# Patient Record
Sex: Male | Born: 1985
Health system: Southern US, Community
[De-identification: ages and names within clinical notes are randomized; demographics above are authoritative.]

## PROBLEM LIST (undated history)

## (undated) DIAGNOSIS — G43019 Migraine without aura, intractable, without status migrainosus: Secondary | ICD-10-CM

## (undated) HISTORY — PX: OTHER SURGICAL HISTORY: SHX169

## (undated) HISTORY — DX: Migraine without aura, intractable, without status migrainosus: G43.019

---

## 2014-01-20 ENCOUNTER — Encounter (HOSPITAL_COMMUNITY): Payer: Self-pay | Admitting: Emergency Medicine

## 2014-01-20 ENCOUNTER — Emergency Department (HOSPITAL_COMMUNITY)
Admission: EM | Admit: 2014-01-20 | Discharge: 2014-01-20 | Disposition: A | Discharge status: Home / Self Care | Payer: 59 | Attending: Emergency Medicine | Admitting: Emergency Medicine

## 2014-01-20 DIAGNOSIS — Y9389 Activity, other specified: Secondary | ICD-10-CM | POA: Insufficient documentation

## 2014-01-20 DIAGNOSIS — Z23 Encounter for immunization: Secondary | ICD-10-CM | POA: Insufficient documentation

## 2014-01-20 DIAGNOSIS — S0501XA Injury of conjunctiva and corneal abrasion without foreign body, right eye, initial encounter: Secondary | ICD-10-CM

## 2014-01-20 DIAGNOSIS — IMO0002 Reserved for concepts with insufficient information to code with codable children: Secondary | ICD-10-CM | POA: Insufficient documentation

## 2014-01-20 DIAGNOSIS — Y929 Unspecified place or not applicable: Secondary | ICD-10-CM | POA: Insufficient documentation

## 2014-01-20 DIAGNOSIS — S058X9A Other injuries of unspecified eye and orbit, initial encounter: Secondary | ICD-10-CM | POA: Insufficient documentation

## 2014-01-20 MED ORDER — IBUPROFEN 400 MG PO TABS
800.0000 mg | ORAL_TABLET | Freq: Once | ORAL | Status: AC
  Administered 2014-01-20: 800 mg via ORAL
  Filled 2014-01-20: qty 2

## 2014-01-20 MED ORDER — POLYMYXIN B-TRIMETHOPRIM 10000-0.1 UNIT/ML-% OP SOLN: 1.0000 [drp] | OPHTHALMIC | Status: AC

## 2014-01-20 MED ORDER — TETANUS-DIPHTH-ACELL PERTUSSIS 5-2.5-18.5 LF-MCG/0.5 IM SUSP
0.5000 mL | Freq: Once | INTRAMUSCULAR | Status: AC
  Administered 2014-01-20: 0.5 mL via INTRAMUSCULAR
  Filled 2014-01-20: qty 0.5

## 2014-01-20 MED ORDER — TETRACAINE HCL 0.5 % OP SOLN
1.0000 [drp] | Freq: Once | OPHTHALMIC | Status: AC
  Administered 2014-01-20: 1 [drp] via OPHTHALMIC
  Filled 2014-01-20: qty 2

## 2014-01-20 MED ORDER — FLUORESCEIN SODIUM 1 MG OP STRP
1.0000 | ORAL_STRIP | Freq: Once | OPHTHALMIC | Status: AC
  Administered 2014-01-20: 1 via OPHTHALMIC
  Filled 2014-01-20: qty 1

## 2014-01-20 NOTE — ED Notes (Signed)
Pt sts daughter scratched right eye with finger nail today; pt noted to have tearing and redness to right eye; pt denies vision change

## 2014-01-20 NOTE — Discharge Instructions (Signed)
Take Ibuprofen or Tylenol for pain  Use antibiotic drops and follow-up with the eye doctor next week  Return to the emergency department if you develop any changing/worsening condition, fever, eye swelling/redness, drainage of pus, or any other concerns (please read additional information regarding your condition below)  Corneal Abrasion The cornea is the clear covering at the front and center of the eye. When looking at the colored portion of the eye (iris), you are looking through the cornea. This very thin tissue is made up of many layers. The surface layer is a single layer of cells (corneal epithelium) and is one of the most sensitive tissues in the body. If a scratch or injury causes the corneal epithelium to come off, it is called a corneal abrasion. If the injury extends to the tissues below the epithelium, the condition is called a corneal ulcer. CAUSES   Scratches.  Trauma.  Foreign body in the eye. Some people have recurrences of abrasions in the area of the original injury even after it has healed (recurrent erosion syndrome). Recurrent erosion syndrome generally improves and goes away with time. SYMPTOMS   Eye pain.  Difficulty or inability to keep the injured eye open.  The eye becomes very sensitive to light.  Recurrent erosions tend to happen suddenly, first thing in the morning, usually after waking up and opening the eye. DIAGNOSIS  Your health care provider can diagnose a corneal abrasion during an eye exam. Dye is usually placed in the eye using a drop or a small paper strip moistened by your tears. When the eye is examined with a special light, the abrasion shows up clearly because of the dye. TREATMENT   Small abrasions may be treated with antibiotic drops or ointment alone.  Usually a pressure patch is specially applied. Pressure patches prevent the eye from blinking, allowing the corneal epithelium to heal. A pressure patch also reduces the amount of pain present  in the eye during healing. Most corneal abrasions heal within 2 3 days with no effect on vision. If the abrasion becomes infected and spreads to the deeper tissues of the cornea, a corneal ulcer can result. This is serious because it can cause corneal scarring. Corneal scars interfere with light passing through the cornea and cause a loss of vision in the involved eye. HOME CARE INSTRUCTIONS  Use medicine or ointment as directed. Only take over-the-counter or prescription medicines for pain, discomfort, or fever as directed by your health care provider.  Do not drive or operate machinery while your eye is patched. Your ability to judge distances is impaired.  If your health care provider has given you a follow-up appointment, it is very important to keep that appointment. Not keeping the appointment could result in a severe eye infection or permanent loss of vision. If there is any problem keeping the appointment, let your health care provider know. SEEK MEDICAL CARE IF:   You have pain, light sensitivity, and a scratchy feeling in one eye or both eyes.  Your pressure patch keeps loosening up, and you can blink your eye under the patch after treatment.  Any kind of discharge develops from the eye after treatment or if the lids stick together in the morning.  You have the same symptoms in the morning as you did with the original abrasion days, weeks, or months after the abrasion healed. MAKE SURE YOU:   Understand these instructions.  Will watch your condition.  Will get help right away if you are not  doing well or get worse. Document Released: 10/02/2000 Document Revised: 07/26/2013 Document Reviewed: 06/12/2013 [facility] Patient Information 2014 [employer].   Emergency Department Resource Guide 1) Find a Doctor and Pay Out of Pocket Although you won't have to find out who is covered by your insurance plan, it is a good idea to ask around and get recommendations. You will then need  to call the office and see if the doctor you have chosen will accept you as a new patient and what types of options they offer for patients who are self-pay. Some doctors offer discounts or will set up payment plans for their patients who do not have insurance, but you will need to ask so you aren't surprised when you get to your appointment.  2) Contact Your Local Health Department Not all health departments have doctors that can see patients for sick visits, but many do, so it is worth a call to see if yours does. If you don't know where your local health department is, you can check in your phone book. The CDC also has a tool to help you locate your state's health department, and many state websites also have listings of all of their local health departments.  3) Find a [facility] If your illness is not likely to be very severe or complicated, you may want to try a walk in clinic. These are popping up all over the country in pharmacies, drugstores, and shopping centers. They're usually staffed by nurse practitioners or physician assistants that have been trained to treat common illnesses and complaints. They're usually fairly quick and inexpensive. However, if you have serious medical issues or chronic medical problems, these are probably not your best option.  No Primary Care Doctor: - Call Health Connect at  [phone] - they can help you locate a primary care doctor that  accepts your insurance, provides certain services, etc. - Physician Referral Service- [phone]  Chronic Pain Problems: Organization         Address  Phone   Notes  [facility]  [phone] Patients need to be referred by their primary care doctor.   Medication Assistance: Organization         Address  Phone   Notes  [facility] Medication [facility] [address], [place] [address] [phone] --Must be a resident of [facility] -- Must have NO insurance  coverage whatsoever (no Medicaid/ Medicare, etc.) -- The pt. MUST have a primary care doctor that directs their care regularly and follows them in the community   MedAssist  [phone]   [employer]  [phone]    Agencies that provide inexpensive medical care: Organization         Address  Phone   Notes  [place]  [phone]   [facility]    [phone]   [facility] [place], [place] [address] [phone]   Bernie [address] [place] [phone]   Planned Parenthood    [phone]   [facility]    [phone]   [place] and [place] [address], [place] Phone:  [phone], Fax:  [phone] Hours of Operation:  9 am - 6 pm, M-F.  Also accepts Medicaid/Medicare and self-pay.  [facility] for [address], [place] Phone: [phone], Fax: [phone]. Hours of Operation:  8:30 am - 5:30 pm, M-F.  Also accepts Medicaid and self-pay.  [facility] [place] [address] [place] Phone: [phone]   [place], [place], [place] [phone], Ext. [phone] Mondays & Thursdays: 7-9 AM.  First 15 patients are seen on a first come, first serve basis.    [place] Providers:  Organization         Address  Phone   Notes  [facility] [address], [place] [phone] Also accepts self-pay patients.  [facility] [address], [place]  [phone]   [place], [address], [place] [phone]   [facility] [address], [place] [phone]   Lucianne Lei [address], [place]   [phone] Only accepts [place] Access [place] patients after they have their  name applied to their card.   Self-Pay (no insurance) in [facility]:  Organization         Address  Phone   Notes  Sickle Cell Patients, [facility] Internal [facility] [place] [phone]   [facility] [place] [phone]   [facility] [place]  [place], [place], [place] [phone]   [facility]/Dr. Osei-Bonsu  [address] [place] or [address], [place] [phone] Phone number for both [place] and [place] locations is the same.  Urgent Medical and [facility] [address], [place] [phone]   [facility] [address], [place] or [address] [phone]   [facility] [address], [place] [phone], phone; [phone], fax Sees patients 1st and 3rd Saturday of every month.  Must not qualify for public or private insurance (i.e. Medicaid, Medicare, [place] Health Choice, Veterans' Benefits)  Household income should be no more than 200% of the poverty level The clinic cannot treat you if you are pregnant or think you are pregnant  Sexually transmitted diseases are not treated at the clinic.    Dental Care: Organization         Address  Phone  Notes  [facility] [place] [phone] Accepts children up to age 109 who are enrolled in [place] or [place]; pregnant women with a Medicaid card; and children who have applied for Medicaid or [place] Health Choice, but were declined, whose parents can pay a reduced fee at time of service.  [facility] Department of [facility]  [address], [place] [phone] Accepts children up to age 33 who are enrolled in [place] or [place]; pregnant women with a Medicaid card; and children who have applied for  Medicaid or [place] Health Choice, but were declined, whose parents can pay a reduced fee at time of service.  [facility] Access PRO[facility]  [place] [phone] Patients are seen by appointment only. Walk-ins are not accepted. [place] will see patients 81 years of age and older. Monday - Tuesday (8am-5pm) Most Wednesdays (8:30-5pm) $30 per visit, cash only  [place] Adult [employer] PROGRAM  [address], [employer] [phone])  [phone] Patients are seen by appointment only. Walk-ins are not accepted. [place] will see patients 55 years of age and older. One Wednesday Evening (Monthly: Volunteer Based).  $30 per visit, cash only  [place]  [phone] for adults; Children under age 83, call Graduate Pediatric Dentistry at [phone]. Children aged 71-14, please call [phone] to request a pediatric application.  Dental services are provided in all areas of dental care including fillings, crowns and bridges, complete and partial dentures, implants, gum treatment, root canals, and extractions. Preventive care is also provided. Treatment is provided to both adults and children. Patients are selected via a lottery and there is often a waiting list.   [facility] [address] [place]  [phone] [email or web address]   [facility] [address] [place], [place] [phone], Ext. [phone] Second and Fourth Thursday of each month, opens at 6:30 AM; Clinic ends at 9 AM.  Patients are seen on a first-come first-served basis, and a limited number are seen during each clinic.   [facility]  [address] [place] D[place], [place] [phone]   Eligibility Requirements You must have lived in [place], [place], or [place] counties for at least the last three months.   You cannot be eligible for state or federal sponsored [employer], including [employer], [place],  or [employer].   You generally cannot be eligible for healthcare insurance through your employer.    How to apply: Eligibility screenings are held every Tuesday and Wednesday afternoon from 1:00 pm until 4:00 pm. You do not need an appointment for the interview!  [facility] [address], [place], [place]   [place]  Novelty Department  [place]  [phone]    Behavioral Health Resources in the Community: Intensive Outpatient Programs Organization         Address  Phone  Notes  [place]. [address], [place], [place] [phone]   [facility] Outpatient [address], [place], [place]   ADS: Alcohol & Drug Svcs [address] [place], [place]   [place] [address],  [place], [place] or [phone]   Substance Abuse Resources Organization         Address  Phone  Notes  Alcohol and Drug Services  [phone]   [place]  [phone]   [place]   Chinita Pester  [phone]   Residential & Outpatient Substance Abuse Program  [phone]   Psychological Services Organization         Address  Phone  Notes  [facility] [place]  [place]  [phone]   [place] [address], [place] or [phone]    Mobile Crisis Teams Organization         Address  Phone  Notes  [facility], Mobile Crisis Care Unit  [phone]   [facility]ive Psychotherapeutic Services  [address] [place], [place]   [place] Levels [address] [place] [phone]    Self-Help/Support Groups Organization         Address  Phone             Notes  [place]. of [place] - variety of support groups   [phone]-  [phone] Call for more information  Narcotics Anonymous (NA), [facility]aring [facility] [address], [place]  2 meetings at this location   Residential Sports administratorTreatment Programs Organization         Address  Phone  Notes  [facility] [address],    [place]  [phone]   [facility]  [address], [place] [phone], [place], [place] [phone]   [facility] [address], [place] [phone] Admissions: 8am-3pm M-F  [facility] [address]    [place], [place] [phone]   [facility] [address], [place], [place] [phone]   [facility] [address]  [place], [place] [phone]   [facility] - Intensive Outpatient [address], [place], [place] [phone]   [facility] ([facility] [address]  [place], [place] [phone] or [phone]   [facility]idential [facility] Services (RTS) [address] [place], [place] [phone] Accepts Medicaid  [facility] [address]  [place] [phone] Substance Abuse/Addiction Treatment   [facility] Behavioral Health Resources Organization         Address  Phone  Notes  [facility]  [phone]) [phone]   Angie FavaJulie Brannon, PhD [address], [address] [place], [place]   [phone] or [phone](336) [phone]   [facility]   [address] [place], [place] [phone]) [phone]   [facility] [address], [place], [place] [phone]) [phone] Insurance/Medicaid/sponsorship through [facility]  [facility]ith and Families [address]                                    [place], [place] [phone]) [phone] Therapy/tele-psych/case  [facility] [address]   [place], [place] [phone]    Dr. Lolly MustacheArfeen  [phone](336) [phone]   [facility]  [employer] [facility] 1) [address], [place] 2) [address], [place] 3)  [address], [place] [phone]  [phone]   [facility] Child Abuse Hotline [phone](336) [phone] or [phone]) [phone] (After Hours)

## 2014-01-20 NOTE — ED Provider Notes (Signed)
CSN: [id number]     Arrival date & time 01/20/14  1209 History  This chart was scribed for non-physician practitioner, Vernie Murders, PA-C working with Threasa Beards, MD by Frederich Balding, ED scribe. This patient was seen in room TR04C/TR04C and the patient's care was started at 1:23 PM.   Chief Complaint  Patient presents with  . Eye Injury   The history is provided by the patient. No language interpreter was used.   HPI Comments: Angel Hayes is a 28 y.o. male who presents to the Emergency Department complaining of right eye injury that occurred earlier today. Pt states his daughter scratched his eye with her fingernail today. He has sudden onset right eye pain with associated redness, tearing and photophobia. Denies fever, pus drainage, visual disturbance, visual loss, nausea, emesis. Pt does not wear contacts or glasses. He is unsure of when his last tetanus was.    History reviewed. No pertinent past medical history. History reviewed. No pertinent past surgical history. History reviewed. No pertinent family history. History  Substance Use Topics  . Smoking status: Never Smoker   . Smokeless tobacco: Not on file  . Alcohol Use: No    Review of Systems  Constitutional: Negative for fever.  Eyes: Positive for photophobia, pain and redness. Negative for discharge, itching and visual disturbance.  Gastrointestinal: Negative for nausea and vomiting.  Skin: Negative for wound.  All other systems reviewed and are negative.   Allergies  Review of patient's allergies indicates no known allergies.  Home Medications  No current outpatient prescriptions on file. BP 118/80  Pulse 77  Temp(Src) 98 F (36.7 C) (Oral)  Resp 16  SpO2 98%  Filed Vitals:   01/20/14 1219 01/20/14 1402  BP: 118/80 118/78  Pulse: 77 64  Temp: 98 F (36.7 C) 97.9 F (36.6 C)  TempSrc: Oral Oral  Resp: 16 16  SpO2: 98% 100%    Physical Exam  Nursing note and vitals reviewed. Constitutional: He  is oriented to person, place, and time. He appears well-developed and well-nourished. No distress.  HENT:  Head: Normocephalic and atraumatic.  Right Ear: External ear normal.  Left Ear: External ear normal.  Nose: Nose normal.  Mouth/Throat: Oropharynx is clear and moist.  Eyes: EOM are normal. Pupils are equal, round, and reactive to light. Left eye exhibits no discharge.    Right bulbar conjunctiva injected throughout. No masses or edema to the right eyelids. No pain with eye movement. No signs of eye trauma. Increased tearing on the right. No edema or erythema to the eight periorbit.   Neck: Normal range of motion. Neck supple. No tracheal deviation present.  No cervical lymphadenopathy. No nuchal rigidity.  Cardiovascular: Normal rate.   Pulmonary/Chest: Effort normal. No respiratory distress.  Musculoskeletal: Normal range of motion.  Neurological: He is alert and oriented to person, place, and time.  Skin: Skin is warm and dry. He is not diaphoretic.  Psychiatric: He has a normal mood and affect. His behavior is normal.    ED Course  Procedures (including critical care time)  DIAGNOSTIC STUDIES: Oxygen Saturation is 98% on RA, normal by my interpretation.    COORDINATION OF CARE: 1:28 PM-Discussed treatment plan which includes updating tetanus, antibiotic eye drops and ophthalmology follow up with pt at bedside and pt agreed to plan.   Labs Review Labs Reviewed - No data to display Imaging Review No results found.   EKG Interpretation None      MDM   Duante Trego  is a 28 y.o. male who presents to the Emergency Department complaining of right eye injury that occurred earlier today.   Rechecks  12:30 PM = Woods lamp exam revealed oblique 18mm linear abrasion to the right cornea. No foreign bodies.  2:00 PM = Bilateral Near: 20/15 ; Bilateral Distance: 20/15 ; R Near: 20/20 ; R Distance: 20/20 ; L Near: 20/20 ; L Distance: 20/20   Etiology of eye pain likely due to  corneal abrasion seen on Woods lamp exam. No foreign bodies. No orbital cellulitis. Patient afebrile and non-toxic in appearance. Vital signs stable. Antibiotic drops and ophthalmology follow-up. Return precautions, discharge instructions, and follow-up was discussed with the patient before discharge.    Discharge Medication List as of 01/20/2014  1:35 PM    START taking these medications   Details  trimethoprim-polymyxin b (POLYTRIM) ophthalmic solution Place 1 drop into the right eye every 4 (four) hours., Starting 01/20/2014, Last dose on Thu 01/25/14, Print        Final impressions: 1. Corneal abrasion, right      Denman George   I personally performed the services described in this documentation, which was scribed in my presence. The recorded information has been reviewed and is accurate.  Lucila [place], PA-C 01/21/14 1026

## 2014-01-21 NOTE — ED Provider Notes (Signed)
Medical screening examination/treatment/procedure(s) were performed by non-physician practitioner and as supervising physician I was immediately available for consultation/collaboration.   EKG Interpretation None       Threasa Beards, MD 01/21/14 [phone]

## 2017-02-12 DIAGNOSIS — E559 Vitamin D deficiency, unspecified: Secondary | ICD-10-CM | POA: Diagnosis not present

## 2017-02-12 DIAGNOSIS — R5383 Other fatigue: Secondary | ICD-10-CM | POA: Diagnosis not present

## 2017-04-16 DIAGNOSIS — R42 Dizziness and giddiness: Secondary | ICD-10-CM | POA: Diagnosis not present

## 2017-06-17 DIAGNOSIS — M25532 Pain in left wrist: Secondary | ICD-10-CM | POA: Diagnosis not present

## 2017-08-11 DIAGNOSIS — Z23 Encounter for immunization: Secondary | ICD-10-CM | POA: Diagnosis not present

## 2017-08-13 DIAGNOSIS — M25532 Pain in left wrist: Secondary | ICD-10-CM | POA: Diagnosis not present

## 2017-08-19 DIAGNOSIS — M654 Radial styloid tenosynovitis [de Quervain]: Secondary | ICD-10-CM | POA: Diagnosis not present

## 2017-08-27 DIAGNOSIS — R05 Cough: Secondary | ICD-10-CM | POA: Diagnosis not present

## 2017-09-02 DIAGNOSIS — J343 Hypertrophy of nasal turbinates: Secondary | ICD-10-CM | POA: Diagnosis not present

## 2017-09-02 DIAGNOSIS — Q385 Congenital malformations of palate, not elsewhere classified: Secondary | ICD-10-CM | POA: Diagnosis not present

## 2017-09-16 DIAGNOSIS — M654 Radial styloid tenosynovitis [de Quervain]: Secondary | ICD-10-CM | POA: Diagnosis not present

## 2017-10-07 DIAGNOSIS — Z124 Encounter for screening for malignant neoplasm of cervix: Secondary | ICD-10-CM | POA: Diagnosis not present

## 2017-10-08 DIAGNOSIS — Z13220 Encounter for screening for lipoid disorders: Secondary | ICD-10-CM | POA: Diagnosis not present

## 2017-10-08 DIAGNOSIS — Z0000 Encounter for general adult medical examination without abnormal findings: Secondary | ICD-10-CM | POA: Diagnosis not present

## 2017-10-21 DIAGNOSIS — M654 Radial styloid tenosynovitis [de Quervain]: Secondary | ICD-10-CM | POA: Diagnosis not present

## 2018-02-24 ENCOUNTER — Other Ambulatory Visit: Payer: Self-pay | Admitting: Orthopedic Surgery

## 2018-02-24 DIAGNOSIS — M654 Radial styloid tenosynovitis [de Quervain]: Secondary | ICD-10-CM

## 2018-03-18 ENCOUNTER — Ambulatory Visit
Admission: RE | Admit: 2018-03-18 | Discharge: 2018-03-18 | Disposition: A | Discharge status: Home / Self Care | Payer: 59 | Source: Ambulatory Visit | Attending: Orthopedic Surgery | Admitting: Orthopedic Surgery

## 2018-03-18 DIAGNOSIS — M654 Radial styloid tenosynovitis [de Quervain]: Secondary | ICD-10-CM

## 2018-03-18 MED ORDER — IOPAMIDOL (ISOVUE-M 200) INJECTION 41%
2.0000 mL | Freq: Once | INTRAMUSCULAR | Status: AC
  Administered 2018-03-18: 2 mL via INTRA_ARTICULAR

## 2018-03-22 DIAGNOSIS — M654 Radial styloid tenosynovitis [de Quervain]: Secondary | ICD-10-CM | POA: Diagnosis not present

## 2018-03-22 DIAGNOSIS — M25332 Other instability, left wrist: Secondary | ICD-10-CM | POA: Diagnosis not present

## 2018-03-25 DIAGNOSIS — R51 Headache: Secondary | ICD-10-CM | POA: Diagnosis not present

## 2018-03-25 DIAGNOSIS — E559 Vitamin D deficiency, unspecified: Secondary | ICD-10-CM | POA: Diagnosis not present

## 2018-03-25 DIAGNOSIS — R5383 Other fatigue: Secondary | ICD-10-CM | POA: Diagnosis not present

## 2018-03-25 DIAGNOSIS — R718 Other abnormality of red blood cells: Secondary | ICD-10-CM | POA: Diagnosis not present

## 2018-04-14 DIAGNOSIS — M654 Radial styloid tenosynovitis [de Quervain]: Secondary | ICD-10-CM | POA: Diagnosis not present

## 2018-04-14 DIAGNOSIS — M25332 Other instability, left wrist: Secondary | ICD-10-CM | POA: Diagnosis not present

## 2018-04-25 DIAGNOSIS — M6629 Spontaneous rupture of extensor tendons, multiple sites: Secondary | ICD-10-CM | POA: Diagnosis not present

## 2018-04-25 DIAGNOSIS — M66232 Spontaneous rupture of extensor tendons, left forearm: Secondary | ICD-10-CM | POA: Diagnosis not present

## 2018-04-25 DIAGNOSIS — M659 Synovitis and tenosynovitis, unspecified: Secondary | ICD-10-CM | POA: Diagnosis not present

## 2018-04-28 DIAGNOSIS — M654 Radial styloid tenosynovitis [de Quervain]: Secondary | ICD-10-CM | POA: Diagnosis not present

## 2018-04-28 DIAGNOSIS — S66912D Strain of unspecified muscle, fascia and tendon at wrist and hand level, left hand, subsequent encounter: Secondary | ICD-10-CM | POA: Diagnosis not present

## 2018-04-28 DIAGNOSIS — M25332 Other instability, left wrist: Secondary | ICD-10-CM | POA: Diagnosis not present

## 2018-05-05 DIAGNOSIS — S66912A Strain of unspecified muscle, fascia and tendon at wrist and hand level, left hand, initial encounter: Secondary | ICD-10-CM | POA: Diagnosis not present

## 2018-05-05 DIAGNOSIS — M25332 Other instability, left wrist: Secondary | ICD-10-CM | POA: Diagnosis not present

## 2018-05-05 DIAGNOSIS — S66912D Strain of unspecified muscle, fascia and tendon at wrist and hand level, left hand, subsequent encounter: Secondary | ICD-10-CM | POA: Diagnosis not present

## 2018-05-18 DIAGNOSIS — M25642 Stiffness of left hand, not elsewhere classified: Secondary | ICD-10-CM | POA: Diagnosis not present

## 2018-05-18 DIAGNOSIS — S66912D Strain of unspecified muscle, fascia and tendon at wrist and hand level, left hand, subsequent encounter: Secondary | ICD-10-CM | POA: Diagnosis not present

## 2018-05-18 DIAGNOSIS — S66912A Strain of unspecified muscle, fascia and tendon at wrist and hand level, left hand, initial encounter: Secondary | ICD-10-CM | POA: Diagnosis not present

## 2018-05-24 DIAGNOSIS — S66912D Strain of unspecified muscle, fascia and tendon at wrist and hand level, left hand, subsequent encounter: Secondary | ICD-10-CM | POA: Diagnosis not present

## 2018-05-24 DIAGNOSIS — M25642 Stiffness of left hand, not elsewhere classified: Secondary | ICD-10-CM | POA: Diagnosis not present

## 2018-05-24 DIAGNOSIS — S66912A Strain of unspecified muscle, fascia and tendon at wrist and hand level, left hand, initial encounter: Secondary | ICD-10-CM | POA: Diagnosis not present

## 2018-05-26 DIAGNOSIS — M25532 Pain in left wrist: Secondary | ICD-10-CM | POA: Diagnosis not present

## 2018-05-26 DIAGNOSIS — M25332 Other instability, left wrist: Secondary | ICD-10-CM | POA: Diagnosis not present

## 2018-05-26 DIAGNOSIS — S66912A Strain of unspecified muscle, fascia and tendon at wrist and hand level, left hand, initial encounter: Secondary | ICD-10-CM | POA: Diagnosis not present

## 2018-05-31 DIAGNOSIS — M25532 Pain in left wrist: Secondary | ICD-10-CM | POA: Diagnosis not present

## 2018-05-31 DIAGNOSIS — S66912A Strain of unspecified muscle, fascia and tendon at wrist and hand level, left hand, initial encounter: Secondary | ICD-10-CM | POA: Diagnosis not present

## 2018-05-31 DIAGNOSIS — M25642 Stiffness of left hand, not elsewhere classified: Secondary | ICD-10-CM | POA: Diagnosis not present

## 2018-06-01 DIAGNOSIS — M25532 Pain in left wrist: Secondary | ICD-10-CM | POA: Diagnosis not present

## 2018-06-01 DIAGNOSIS — M79645 Pain in left finger(s): Secondary | ICD-10-CM | POA: Diagnosis not present

## 2018-06-01 DIAGNOSIS — S66912A Strain of unspecified muscle, fascia and tendon at wrist and hand level, left hand, initial encounter: Secondary | ICD-10-CM | POA: Diagnosis not present

## 2018-06-03 DIAGNOSIS — S66912A Strain of unspecified muscle, fascia and tendon at wrist and hand level, left hand, initial encounter: Secondary | ICD-10-CM | POA: Diagnosis not present

## 2018-06-03 DIAGNOSIS — M25532 Pain in left wrist: Secondary | ICD-10-CM | POA: Diagnosis not present

## 2018-06-03 DIAGNOSIS — M25642 Stiffness of left hand, not elsewhere classified: Secondary | ICD-10-CM | POA: Diagnosis not present

## 2018-06-07 DIAGNOSIS — M25532 Pain in left wrist: Secondary | ICD-10-CM | POA: Diagnosis not present

## 2018-06-07 DIAGNOSIS — M25642 Stiffness of left hand, not elsewhere classified: Secondary | ICD-10-CM | POA: Diagnosis not present

## 2018-06-07 DIAGNOSIS — M79645 Pain in left finger(s): Secondary | ICD-10-CM | POA: Diagnosis not present

## 2018-06-13 DIAGNOSIS — M25642 Stiffness of left hand, not elsewhere classified: Secondary | ICD-10-CM | POA: Diagnosis not present

## 2018-06-13 DIAGNOSIS — M79645 Pain in left finger(s): Secondary | ICD-10-CM | POA: Diagnosis not present

## 2018-06-13 DIAGNOSIS — M25532 Pain in left wrist: Secondary | ICD-10-CM | POA: Diagnosis not present

## 2018-06-15 ENCOUNTER — Encounter: Payer: Self-pay | Admitting: Neurology

## 2018-06-15 ENCOUNTER — Ambulatory Visit (INDEPENDENT_AMBULATORY_CARE_PROVIDER_SITE_OTHER): Payer: 59 | Admitting: Neurology

## 2018-06-15 DIAGNOSIS — G43019 Migraine without aura, intractable, without status migrainosus: Secondary | ICD-10-CM | POA: Diagnosis not present

## 2018-06-15 HISTORY — DX: Migraine without aura, intractable, without status migrainosus: G43.019

## 2018-06-15 MED ORDER — RIZATRIPTAN BENZOATE 10 MG PO TABS: 10.0000 mg | ORAL_TABLET | Freq: Three times a day (TID) | ORAL | 3 refills | Status: AC | PRN

## 2018-06-15 MED ORDER — PROPRANOLOL HCL 20 MG PO TABS: 20.0000 mg | ORAL_TABLET | Freq: Two times a day (BID) | ORAL | 3 refills | Status: AC

## 2018-06-15 NOTE — Patient Instructions (Signed)
We will start propranolol daily to prevent the headaches.  Maxalt will be taken if needed for the headache.  Inderal (propranolol) is a blood pressure medication that is commonly used for migraine headaches. This is a type of beta blocker. The most common side effects include low heart rate, dizziness, fatigue, and increased depression. This medication may worsen asthma. If you believe that you are having side effects on this medication, please contact our office.

## 2018-06-15 NOTE — Progress Notes (Signed)
Reason for visit: Migraine headache  Referring physician: Dr. Beverlee Nims Sabo is a 32 y.o. male  History of present illness:  Mr. Angel Hayes is a 32 year old [place] male with a history of intractable migraine headache that has been present for about 5 years.  The patient indicates that he is averaging about 10 headache days a month.  The headaches usually start in the shoulders with a tingling sensation and then this will go up in the back of the head with onset of the headache.  The headache may be associated with photophobia and phonophobia, some nausea may be noted.  The patient may feel weak all over and occasionally he may have some vertigo.  The headaches may last 24 hours commonly, and occasionally may last several days.  The patient indicates that the headache itself is not extremely severe, he is able to go to work and function through the headache.  He usually does not take any medications, at times he has dromperidone to take which does help some.  The patient drinks 2 cups of coffee daily, no other caffeinated products.  He denies a family history of migraine.  He believes that bright lights, sleeping too much, stress and noise environments may sometimes bring on headache.  The patient denies any balance issues or difficulty controlling the bowels or the bladder.  He denies any sinus or allergy issues.  Past Medical History:  Diagnosis Date  . Common migraine with intractable migraine 06/15/2018    Past Surgical History:  Procedure Laterality Date  . Left wrist surgery     Tendinopathy    Family History  Problem Relation Age of Onset  . Diabetes Mother   . Hypertension Father   . Migraines Neg Hx     Social history:  reports that he has never smoked. He has never used smokeless tobacco. He reports that he does not drink alcohol or use drugs.  Medications:  Prior to Admission medications   Medication Sig Start Date End Date Taking? Authorizing Provider    Cholecalciferol (VITAMIN D PO) Take by mouth.   Yes [provider]  esomeprazole (NEXIUM) 20 MG packet Take 20 mg by mouth daily.   Yes [provider]  IRON PO Take by mouth.   Yes [provider]  Multiple Vitamins-Minerals (MULTIVITAMIN ADULT PO) Take by mouth.   Yes [provider]     No Known Allergies  ROS:  Out of a complete 14 system review of symptoms, the patient complains only of the following symptoms, and all other reviewed systems are negative.  Fatigue Blurred vision Increased thirst, aching muscles Headache, dizziness Not enough sleep  Blood pressure 124/78, pulse 96, height 6' (1.829 m), weight 181 lb 8 oz (82.3 kg).  Physical Exam  General: The patient is alert and cooperative at the time of the examination.  Eyes: Pupils are equal, round, and reactive to light. Discs are flat bilaterally.  Neck: The neck is supple, no carotid bruits are noted.  Respiratory: The respiratory examination is clear.  Cardiovascular: The cardiovascular examination reveals a regular rate and rhythm, no obvious murmurs or rubs are noted.  Neuromuscular: Range of movement the cervical spine is full.  No crepitus is noted in the temporomandibular joints.  Skin: Extremities are without significant edema.  Neurologic Exam  Mental status: The patient is alert and oriented x 3 at the time of the examination. The patient has apparent normal recent and remote memory, with an apparently normal  attention span and concentration ability.  Cranial nerves: Facial symmetry is present. There is good sensation of the face to pinprick and soft touch bilaterally. The strength of the facial muscles and the muscles to head turning and shoulder shrug are normal bilaterally. Speech is well enunciated, no aphasia or dysarthria is noted. Extraocular movements are full. Visual fields are full. The tongue is midline, and the patient has symmetric elevation of the soft  palate. No obvious hearing deficits are noted.  Motor: The motor testing reveals 5 over 5 strength of all 4 extremities. Good symmetric motor tone is noted throughout.  Sensory: Sensory testing is intact to pinprick, soft touch, vibration sensation, and position sense on all 4 extremities. No evidence of extinction is noted.  Coordination: Cerebellar testing reveals good finger-nose-finger and heel-to-shin bilaterally.  Gait and station: Gait is normal. Tandem gait is normal. Romberg is negative. No drift is seen.  Reflexes: Deep tendon reflexes are symmetric and normal bilaterally. Toes are downgoing bilaterally.   Assessment/Plan:  1.  Common migraine headache, intractable  The patient will be placed on propranolol 20 mg twice daily as a prophylactic medication for the headache.  Maxalt will be given to take if needed.  He will call for any dose adjustments.  He will follow-up in 3 months.  Jill Alexanders MD 06/15/2018 2:35 PM  [facility] [address] [place],  [address]  Phone [phone] Fax [phone]

## 2018-06-16 DIAGNOSIS — D1801 Hemangioma of skin and subcutaneous tissue: Secondary | ICD-10-CM | POA: Diagnosis not present

## 2018-06-16 DIAGNOSIS — L80 Vitiligo: Secondary | ICD-10-CM | POA: Diagnosis not present

## 2018-06-16 DIAGNOSIS — B081 Molluscum contagiosum: Secondary | ICD-10-CM | POA: Diagnosis not present

## 2018-06-17 DIAGNOSIS — M79645 Pain in left finger(s): Secondary | ICD-10-CM | POA: Diagnosis not present

## 2018-06-17 DIAGNOSIS — M25532 Pain in left wrist: Secondary | ICD-10-CM | POA: Diagnosis not present

## 2018-06-17 DIAGNOSIS — M25642 Stiffness of left hand, not elsewhere classified: Secondary | ICD-10-CM | POA: Diagnosis not present

## 2018-06-22 DIAGNOSIS — K219 Gastro-esophageal reflux disease without esophagitis: Secondary | ICD-10-CM | POA: Diagnosis not present

## 2018-06-23 DIAGNOSIS — M25642 Stiffness of left hand, not elsewhere classified: Secondary | ICD-10-CM | POA: Diagnosis not present

## 2018-06-23 DIAGNOSIS — M25532 Pain in left wrist: Secondary | ICD-10-CM | POA: Diagnosis not present

## 2018-06-23 DIAGNOSIS — M79645 Pain in left finger(s): Secondary | ICD-10-CM | POA: Diagnosis not present

## 2018-06-28 DIAGNOSIS — M79645 Pain in left finger(s): Secondary | ICD-10-CM | POA: Diagnosis not present

## 2018-06-28 DIAGNOSIS — M25642 Stiffness of left hand, not elsewhere classified: Secondary | ICD-10-CM | POA: Diagnosis not present

## 2018-06-28 DIAGNOSIS — M25532 Pain in left wrist: Secondary | ICD-10-CM | POA: Diagnosis not present

## 2018-07-05 DIAGNOSIS — M25532 Pain in left wrist: Secondary | ICD-10-CM | POA: Diagnosis not present

## 2018-07-05 DIAGNOSIS — M25642 Stiffness of left hand, not elsewhere classified: Secondary | ICD-10-CM | POA: Diagnosis not present

## 2018-07-05 DIAGNOSIS — M79645 Pain in left finger(s): Secondary | ICD-10-CM | POA: Diagnosis not present

## 2018-07-12 DIAGNOSIS — M79645 Pain in left finger(s): Secondary | ICD-10-CM | POA: Diagnosis not present

## 2018-07-12 DIAGNOSIS — M25532 Pain in left wrist: Secondary | ICD-10-CM | POA: Diagnosis not present

## 2018-07-12 DIAGNOSIS — M25642 Stiffness of left hand, not elsewhere classified: Secondary | ICD-10-CM | POA: Diagnosis not present

## 2018-07-14 DIAGNOSIS — L80 Vitiligo: Secondary | ICD-10-CM | POA: Diagnosis not present

## 2018-07-15 DIAGNOSIS — M25532 Pain in left wrist: Secondary | ICD-10-CM | POA: Diagnosis not present

## 2018-07-15 DIAGNOSIS — M25332 Other instability, left wrist: Secondary | ICD-10-CM | POA: Diagnosis not present

## 2018-07-15 DIAGNOSIS — S66912A Strain of unspecified muscle, fascia and tendon at wrist and hand level, left hand, initial encounter: Secondary | ICD-10-CM | POA: Diagnosis not present

## 2018-07-21 DIAGNOSIS — J069 Acute upper respiratory infection, unspecified: Secondary | ICD-10-CM | POA: Diagnosis not present

## 2018-07-22 DIAGNOSIS — S66912A Strain of unspecified muscle, fascia and tendon at wrist and hand level, left hand, initial encounter: Secondary | ICD-10-CM | POA: Diagnosis not present

## 2018-07-22 DIAGNOSIS — M79645 Pain in left finger(s): Secondary | ICD-10-CM | POA: Diagnosis not present

## 2018-07-22 DIAGNOSIS — M7912 Myalgia of auxiliary muscles, head and neck: Secondary | ICD-10-CM | POA: Diagnosis not present

## 2018-07-22 DIAGNOSIS — R51 Headache: Secondary | ICD-10-CM | POA: Diagnosis not present

## 2018-07-22 DIAGNOSIS — M25332 Other instability, left wrist: Secondary | ICD-10-CM | POA: Diagnosis not present

## 2018-07-22 DIAGNOSIS — M5412 Radiculopathy, cervical region: Secondary | ICD-10-CM | POA: Diagnosis not present

## 2018-07-27 DIAGNOSIS — M5412 Radiculopathy, cervical region: Secondary | ICD-10-CM | POA: Diagnosis not present

## 2018-07-27 DIAGNOSIS — R51 Headache: Secondary | ICD-10-CM | POA: Diagnosis not present

## 2018-07-27 DIAGNOSIS — M7912 Myalgia of auxiliary muscles, head and neck: Secondary | ICD-10-CM | POA: Diagnosis not present

## 2018-07-29 DIAGNOSIS — M5412 Radiculopathy, cervical region: Secondary | ICD-10-CM | POA: Diagnosis not present

## 2018-07-29 DIAGNOSIS — M7912 Myalgia of auxiliary muscles, head and neck: Secondary | ICD-10-CM | POA: Diagnosis not present

## 2018-07-29 DIAGNOSIS — R51 Headache: Secondary | ICD-10-CM | POA: Diagnosis not present

## 2018-08-01 DIAGNOSIS — Q385 Congenital malformations of palate, not elsewhere classified: Secondary | ICD-10-CM | POA: Diagnosis not present

## 2018-08-03 DIAGNOSIS — M7912 Myalgia of auxiliary muscles, head and neck: Secondary | ICD-10-CM | POA: Diagnosis not present

## 2018-08-03 DIAGNOSIS — R51 Headache: Secondary | ICD-10-CM | POA: Diagnosis not present

## 2018-08-03 DIAGNOSIS — M5412 Radiculopathy, cervical region: Secondary | ICD-10-CM | POA: Diagnosis not present

## 2018-08-05 DIAGNOSIS — M25332 Other instability, left wrist: Secondary | ICD-10-CM | POA: Diagnosis not present

## 2018-08-05 DIAGNOSIS — M25532 Pain in left wrist: Secondary | ICD-10-CM | POA: Diagnosis not present

## 2018-08-05 DIAGNOSIS — M79645 Pain in left finger(s): Secondary | ICD-10-CM | POA: Diagnosis not present

## 2018-08-08 ENCOUNTER — Other Ambulatory Visit: Payer: Self-pay | Admitting: Internal Medicine

## 2018-08-08 ENCOUNTER — Ambulatory Visit
Admission: RE | Admit: 2018-08-08 | Discharge: 2018-08-08 | Disposition: A | Discharge status: Home / Self Care | Payer: 59 | Source: Ambulatory Visit | Attending: Internal Medicine | Admitting: Internal Medicine

## 2018-08-08 DIAGNOSIS — R059 Cough, unspecified: Secondary | ICD-10-CM

## 2018-08-08 DIAGNOSIS — R05 Cough: Secondary | ICD-10-CM

## 2018-08-10 DIAGNOSIS — R51 Headache: Secondary | ICD-10-CM | POA: Diagnosis not present

## 2018-08-10 DIAGNOSIS — M7912 Myalgia of auxiliary muscles, head and neck: Secondary | ICD-10-CM | POA: Diagnosis not present

## 2018-08-10 DIAGNOSIS — M5412 Radiculopathy, cervical region: Secondary | ICD-10-CM | POA: Diagnosis not present

## 2018-08-11 DIAGNOSIS — M654 Radial styloid tenosynovitis [de Quervain]: Secondary | ICD-10-CM | POA: Diagnosis not present

## 2018-08-11 DIAGNOSIS — M25332 Other instability, left wrist: Secondary | ICD-10-CM | POA: Diagnosis not present

## 2018-08-11 DIAGNOSIS — S66912A Strain of unspecified muscle, fascia and tendon at wrist and hand level, left hand, initial encounter: Secondary | ICD-10-CM | POA: Diagnosis not present

## 2018-08-12 DIAGNOSIS — M7912 Myalgia of auxiliary muscles, head and neck: Secondary | ICD-10-CM | POA: Diagnosis not present

## 2018-08-12 DIAGNOSIS — M5412 Radiculopathy, cervical region: Secondary | ICD-10-CM | POA: Diagnosis not present

## 2018-08-12 DIAGNOSIS — R51 Headache: Secondary | ICD-10-CM | POA: Diagnosis not present

## 2018-08-18 ENCOUNTER — Telehealth: Payer: Self-pay | Admitting: Neurology

## 2018-08-18 MED ORDER — DEXAMETHASONE 2 MG PO TABS: ORAL_TABLET | ORAL | 0 refills | Status: AC

## 2018-08-18 NOTE — Telephone Encounter (Signed)
I spoke with pt. to confirm his medications.  He sts. he is not taking Inderal regularly (it is rx'd as a scheduled bid med). Sts. Maxalt has not helped his current migraine, which started 4 days ago. He has a f/u with Dr. Jannifer Franklin oln 08/23/18. I have advised he begin taking Propranolol as rx'd, so that he can realize it's full benefit as a h/a preventative. He is agreeable. Will check with Dr. Jannifer Franklin to see if there are other options to treat his current migraine./fim

## 2018-08-18 NOTE — Telephone Encounter (Signed)
I called the patient.  The patient has had a headache for the last 4 days, he apparently is not taking his propranolol daily, he is to get back on the medication daily and take the Maxalt if needed.  I will give him a 3-day course of Decadron for his current headache.  He will be seen in the office next week.

## 2018-08-18 NOTE — Telephone Encounter (Signed)
Pt requesting a call stating he has had a h/a for the past 4 days medication Propranolol/Mazalt hasn't helped. Requesting a call to advise on what to do until upcoming appt

## 2018-08-19 DIAGNOSIS — M25332 Other instability, left wrist: Secondary | ICD-10-CM | POA: Diagnosis not present

## 2018-08-19 DIAGNOSIS — M25642 Stiffness of left hand, not elsewhere classified: Secondary | ICD-10-CM | POA: Diagnosis not present

## 2018-08-19 DIAGNOSIS — M5412 Radiculopathy, cervical region: Secondary | ICD-10-CM | POA: Diagnosis not present

## 2018-08-19 DIAGNOSIS — S66912A Strain of unspecified muscle, fascia and tendon at wrist and hand level, left hand, initial encounter: Secondary | ICD-10-CM | POA: Diagnosis not present

## 2018-08-19 DIAGNOSIS — M7912 Myalgia of auxiliary muscles, head and neck: Secondary | ICD-10-CM | POA: Diagnosis not present

## 2018-08-19 DIAGNOSIS — R51 Headache: Secondary | ICD-10-CM | POA: Diagnosis not present

## 2018-08-23 ENCOUNTER — Other Ambulatory Visit: Payer: Self-pay

## 2018-08-23 ENCOUNTER — Ambulatory Visit (INDEPENDENT_AMBULATORY_CARE_PROVIDER_SITE_OTHER): Payer: 59 | Admitting: Neurology

## 2018-08-23 ENCOUNTER — Encounter: Payer: Self-pay | Admitting: Neurology

## 2018-08-23 VITALS — BP 100/62 | HR 64 | Resp 14 | Ht 72.0 in | Wt 182.0 lb

## 2018-08-23 DIAGNOSIS — M79601 Pain in right arm: Secondary | ICD-10-CM | POA: Diagnosis not present

## 2018-08-23 DIAGNOSIS — G43019 Migraine without aura, intractable, without status migrainosus: Secondary | ICD-10-CM

## 2018-08-23 DIAGNOSIS — M542 Cervicalgia: Secondary | ICD-10-CM

## 2018-08-23 DIAGNOSIS — G4489 Other headache syndrome: Secondary | ICD-10-CM | POA: Diagnosis not present

## 2018-08-23 MED ORDER — METHOCARBAMOL 500 MG PO TABS: 500.0000 mg | ORAL_TABLET | Freq: Three times a day (TID) | ORAL | 1 refills | Status: AC

## 2018-08-23 NOTE — Progress Notes (Signed)
Reason for visit: Migraine headache  Angel Hayes is an 32 y.o. male  History of present illness:  Angel Hayes is a 32 year old right-handed [place] male with a history of intractable migraine headache.  The patient indicates that the headaches usually come up from the back of the head, and the headaches usually last about 2 days.  The patient was placed on propranolol and last seen but he was not taking the medication daily, he was taking it on an as-needed basis.  The patient also takes Maxalt.  The patient indicates that he recently had a headache lasting at least for 5 days, he was placed on Decadron which seemed to help the headache part but he has ongoing neck pain and neck stiffness, he has pain into the right shoulder and down the right arm.  The patient claims that this may be come on with some of his headaches, but usually resolves.  The patient may have episodes of difficulty speaking with the headache, he also has episodes of vertigo.  The patient does feel that the neck is stiff, he denies that turning the head one way the other induces discomfort down the arm.  He has symptoms all the way down to the hand with numbness.  The patient comes to this office for an evaluation.  Past Medical History:  Diagnosis Date  . Common migraine with intractable migraine 06/15/2018    Past Surgical History:  Procedure Laterality Date  . Left wrist surgery     Tendinopathy    Family History  Problem Relation Age of Onset  . Diabetes Mother   . Hypertension Father   . Migraines Neg Hx     Social history:  reports that he has never smoked. He has never used smokeless tobacco. He reports that he does not drink alcohol or use drugs.   No Known Allergies  Medications:  Prior to Admission medications   Medication Sig Start Date End Date Taking? Authorizing Provider  Cholecalciferol (VITAMIN D PO) Take by mouth.   Yes [provider]  dexamethasone (DECADRON) 2 MG tablet Take 3  tablets the first day, 2 the second and 1 the third day 08/18/18  Yes Kathrynn Ducking, MD  esomeprazole (NEXIUM) 20 MG packet Take 20 mg by mouth daily.   Yes [provider]  IRON PO Take by mouth.   Yes [provider]  Multiple Vitamins-Minerals (MULTIVITAMIN ADULT PO) Take by mouth.   Yes [provider]  propranolol (INDERAL) 20 MG tablet Take 1 tablet (20 mg total) by mouth 2 (two) times daily. 06/15/18  Yes Kathrynn Ducking, MD  rizatriptan (MAXALT) 10 MG tablet Take 1 tablet (10 mg total) by mouth 3 (three) times daily as needed for migraine. May repeat in 2 hours if needed 06/15/18  Yes Kathrynn Ducking, MD    ROS:  Out of a complete 14 system review of symptoms, the patient complains only of the following symptoms, and all other reviewed systems are negative.  Headache, speech difficulty, weakness Aching muscles, neck pain, neck stiffness  Blood pressure 100/62, pulse 64, resp. rate 14, height 6' (1.829 m), weight 182 lb (82.6 kg).  Physical Exam  General: The patient is alert and cooperative at the time of the examination.  Skin: No significant peripheral edema is noted.   Neurologic Exam  Mental status: The patient is alert and oriented x 3 at the time of the examination. The patient has apparent normal recent and remote memory,  with an apparently normal attention span and concentration ability.   Cranial nerves: Facial symmetry is present. Speech is normal, no aphasia or dysarthria is noted. Extraocular movements are full. Visual fields are full.  Motor: The patient has good strength in all 4 extremities.  Sensory examination: Soft touch sensation is symmetric on the face, arms, and legs.  Coordination: The patient has good finger-nose-finger and heel-to-shin bilaterally.  Gait and station: The patient has a normal gait. Tandem gait is normal. Romberg is negative. No drift is seen.  Reflexes: Deep tendon reflexes are  symmetric.   Assessment/Plan:  1.  Headache, migraine  2.  Neck pain, right arm pain  The patient has an unusual headache syndrome that is associated with neck discomfort with pain going up into the head, he may have vertigo off and on.  The patient has what sounds like transient aphasia at times, he also has pain in the right shoulder and down the right arm with numbness and sensation of weakness in the right arm.  Given the unusual nature of the symptoms, the patient will be set up for MRI of the brain and MRI of the cervical spine to exclude any structural abnormalities that may be leading to the current symptoms.  The patient will remain on propranolol on a daily basis, I will give him a 7-day course of methocarbamol for the neck stiffness.  The patient will follow-up in 4 months.  Jill Alexanders MD 08/23/2018 3:15 PM  [facility] [address] [place], [place] [address]  Phone [phone] Fax [phone]

## 2018-08-23 NOTE — Patient Instructions (Signed)
Take methocarbamol for the neck stiffness for the next 7 days.

## 2018-08-24 ENCOUNTER — Telehealth: Payer: Self-pay | Admitting: Neurology

## 2018-08-24 DIAGNOSIS — M5412 Radiculopathy, cervical region: Secondary | ICD-10-CM | POA: Diagnosis not present

## 2018-08-24 DIAGNOSIS — M7912 Myalgia of auxiliary muscles, head and neck: Secondary | ICD-10-CM | POA: Diagnosis not present

## 2018-08-24 DIAGNOSIS — R51 Headache: Secondary | ICD-10-CM | POA: Diagnosis not present

## 2018-08-24 NOTE — Telephone Encounter (Signed)
Brain uhc Josem Kaufmann: [id number] (exp. 08/24/18 to 10/08/18)  Cervical pending faxed clinical notes.

## 2018-08-25 NOTE — Telephone Encounter (Signed)
MR Brain wo contrast & MR Cervical spine wo contrast Dr. Jannifer Franklin [facility] Auth: [id number] & [id number] (exp. 08/24/18 to 10/08/18). Patient is scheduled at [facility] for 08/30/18.

## 2018-08-30 ENCOUNTER — Ambulatory Visit (INDEPENDENT_AMBULATORY_CARE_PROVIDER_SITE_OTHER): Payer: 59

## 2018-08-30 DIAGNOSIS — G4489 Other headache syndrome: Secondary | ICD-10-CM

## 2018-08-30 DIAGNOSIS — M542 Cervicalgia: Secondary | ICD-10-CM

## 2018-08-30 DIAGNOSIS — M79601 Pain in right arm: Secondary | ICD-10-CM

## 2018-08-31 DIAGNOSIS — M7912 Myalgia of auxiliary muscles, head and neck: Secondary | ICD-10-CM | POA: Diagnosis not present

## 2018-08-31 DIAGNOSIS — M5412 Radiculopathy, cervical region: Secondary | ICD-10-CM | POA: Diagnosis not present

## 2018-08-31 DIAGNOSIS — R51 Headache: Secondary | ICD-10-CM | POA: Diagnosis not present

## 2018-09-01 ENCOUNTER — Telehealth: Payer: Self-pay | Admitting: Neurology

## 2018-09-01 NOTE — Telephone Encounter (Signed)
     MRI cervical 08/31/18:  IMPRESSION:   Normal MRI cervical spine (without).    MRI brain 08/31/18:  IMPRESSION:   Normal MRI brain (without).

## 2018-09-01 NOTE — Telephone Encounter (Signed)
I called the patient.  MRI of the brain and cervical spine were completely normal, his symptoms are likely related to migraine events.

## 2018-09-02 DIAGNOSIS — L80 Vitiligo: Secondary | ICD-10-CM | POA: Diagnosis not present

## 2018-09-02 DIAGNOSIS — R51 Headache: Secondary | ICD-10-CM | POA: Diagnosis not present

## 2018-09-02 DIAGNOSIS — M5412 Radiculopathy, cervical region: Secondary | ICD-10-CM | POA: Diagnosis not present

## 2018-09-02 DIAGNOSIS — M7912 Myalgia of auxiliary muscles, head and neck: Secondary | ICD-10-CM | POA: Diagnosis not present

## 2018-09-13 DIAGNOSIS — M79645 Pain in left finger(s): Secondary | ICD-10-CM | POA: Diagnosis not present

## 2018-09-13 DIAGNOSIS — M25642 Stiffness of left hand, not elsewhere classified: Secondary | ICD-10-CM | POA: Diagnosis not present

## 2018-09-13 DIAGNOSIS — M25332 Other instability, left wrist: Secondary | ICD-10-CM | POA: Diagnosis not present

## 2018-09-27 DIAGNOSIS — M25332 Other instability, left wrist: Secondary | ICD-10-CM | POA: Diagnosis not present

## 2018-09-27 DIAGNOSIS — S66912D Strain of unspecified muscle, fascia and tendon at wrist and hand level, left hand, subsequent encounter: Secondary | ICD-10-CM | POA: Diagnosis not present

## 2018-09-27 DIAGNOSIS — M25642 Stiffness of left hand, not elsewhere classified: Secondary | ICD-10-CM | POA: Diagnosis not present

## 2018-09-30 DIAGNOSIS — M25532 Pain in left wrist: Secondary | ICD-10-CM | POA: Diagnosis not present

## 2018-09-30 DIAGNOSIS — M25332 Other instability, left wrist: Secondary | ICD-10-CM | POA: Diagnosis not present

## 2018-09-30 DIAGNOSIS — S66912D Strain of unspecified muscle, fascia and tendon at wrist and hand level, left hand, subsequent encounter: Secondary | ICD-10-CM | POA: Diagnosis not present

## 2018-10-10 DIAGNOSIS — B349 Viral infection, unspecified: Secondary | ICD-10-CM | POA: Diagnosis not present

## 2018-10-10 DIAGNOSIS — J0100 Acute maxillary sinusitis, unspecified: Secondary | ICD-10-CM | POA: Diagnosis not present

## 2018-11-08 DIAGNOSIS — K219 Gastro-esophageal reflux disease without esophagitis: Secondary | ICD-10-CM | POA: Diagnosis not present

## 2018-11-08 DIAGNOSIS — Z0000 Encounter for general adult medical examination without abnormal findings: Secondary | ICD-10-CM | POA: Diagnosis not present

## 2018-11-08 DIAGNOSIS — J309 Allergic rhinitis, unspecified: Secondary | ICD-10-CM | POA: Diagnosis not present

## 2018-12-22 ENCOUNTER — Ambulatory Visit: Payer: 59 | Admitting: Neurology

## 2019-03-03 IMAGING — DX DG CHEST 2V
2 series · 2 of 2 positions shown · non-contrast
Comparison: None.

CLINICAL DATA: Cough for a month.

EXAM:
CHEST - 2 VIEW

[dg chest 2 view (1 of 2)]
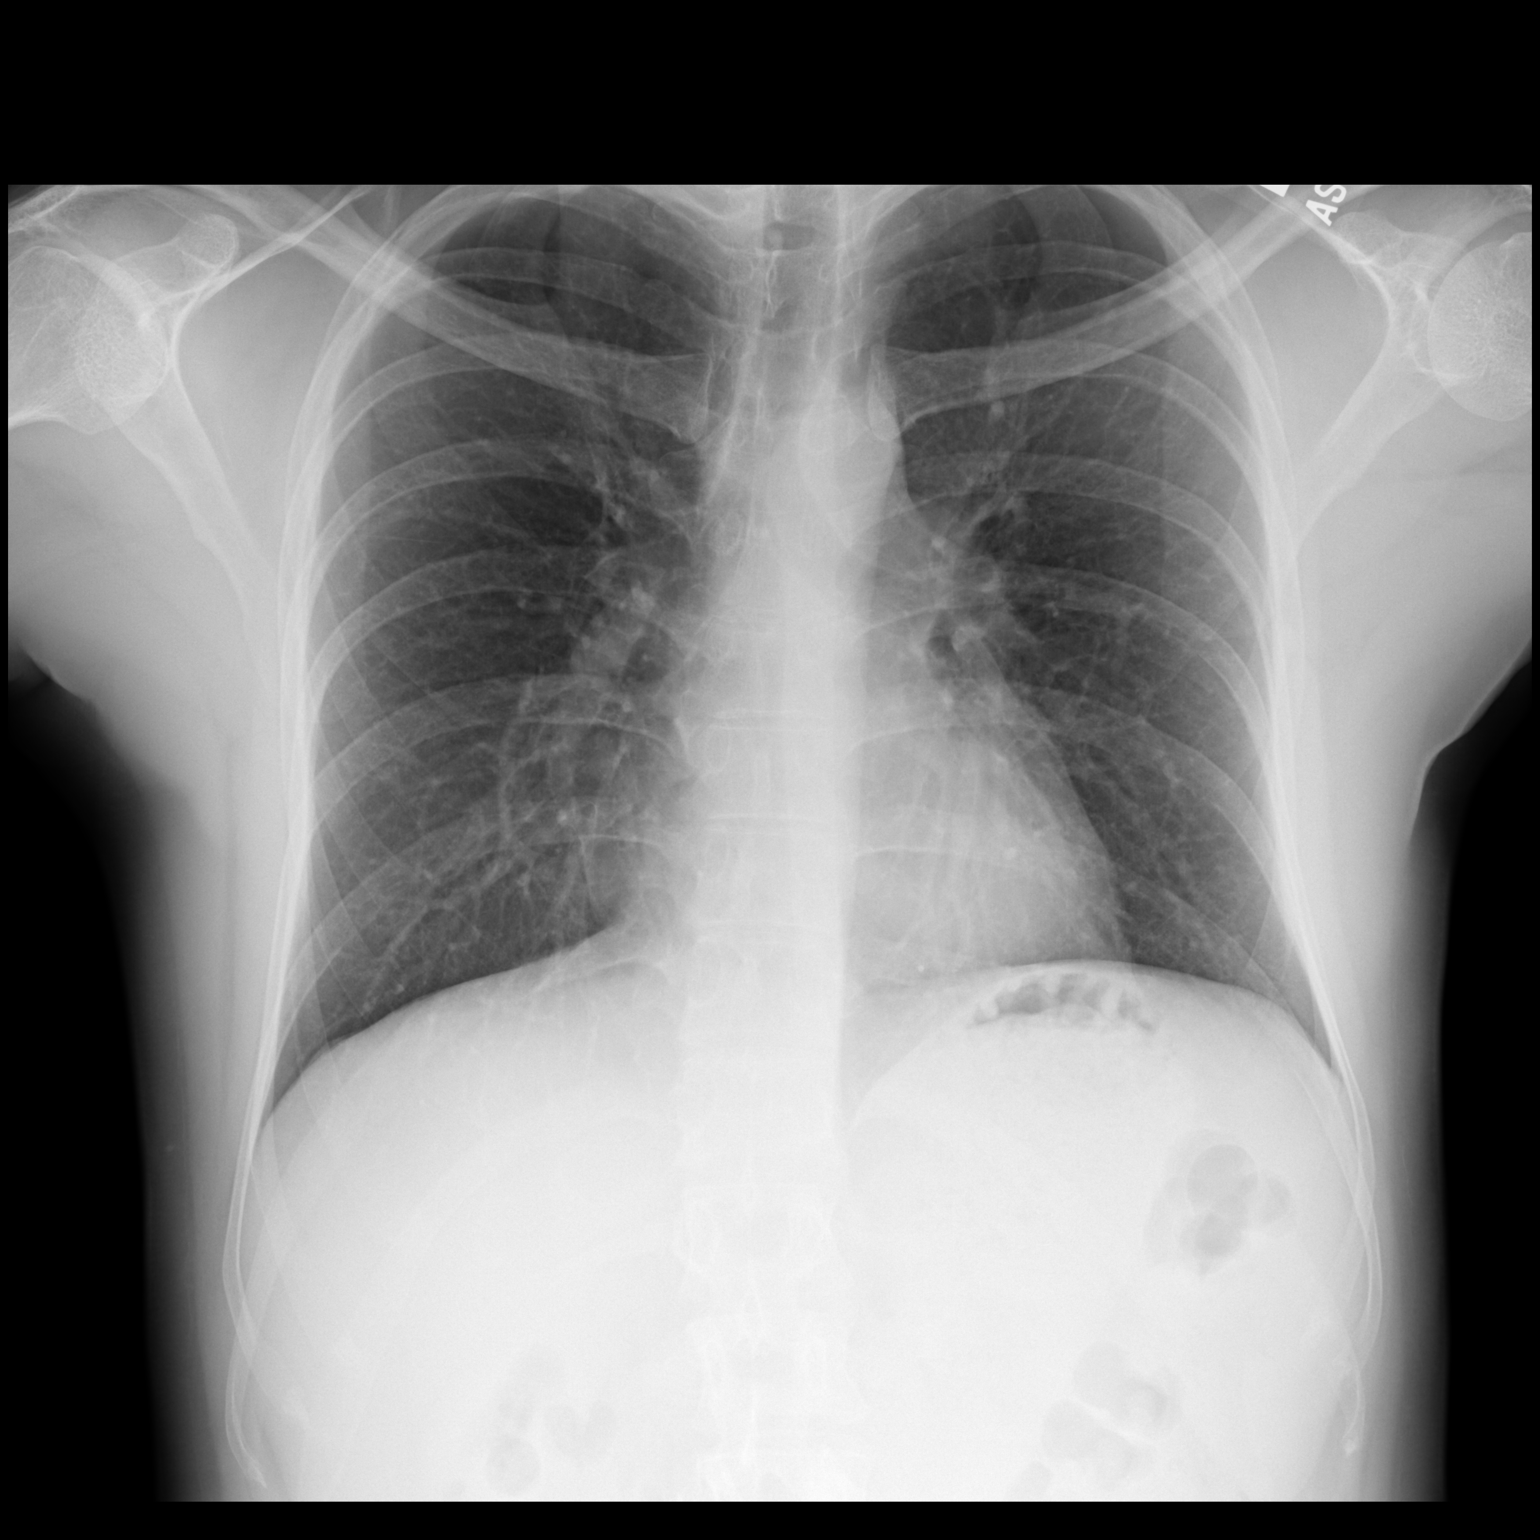

[dg chest 2 view (2 of 2)]
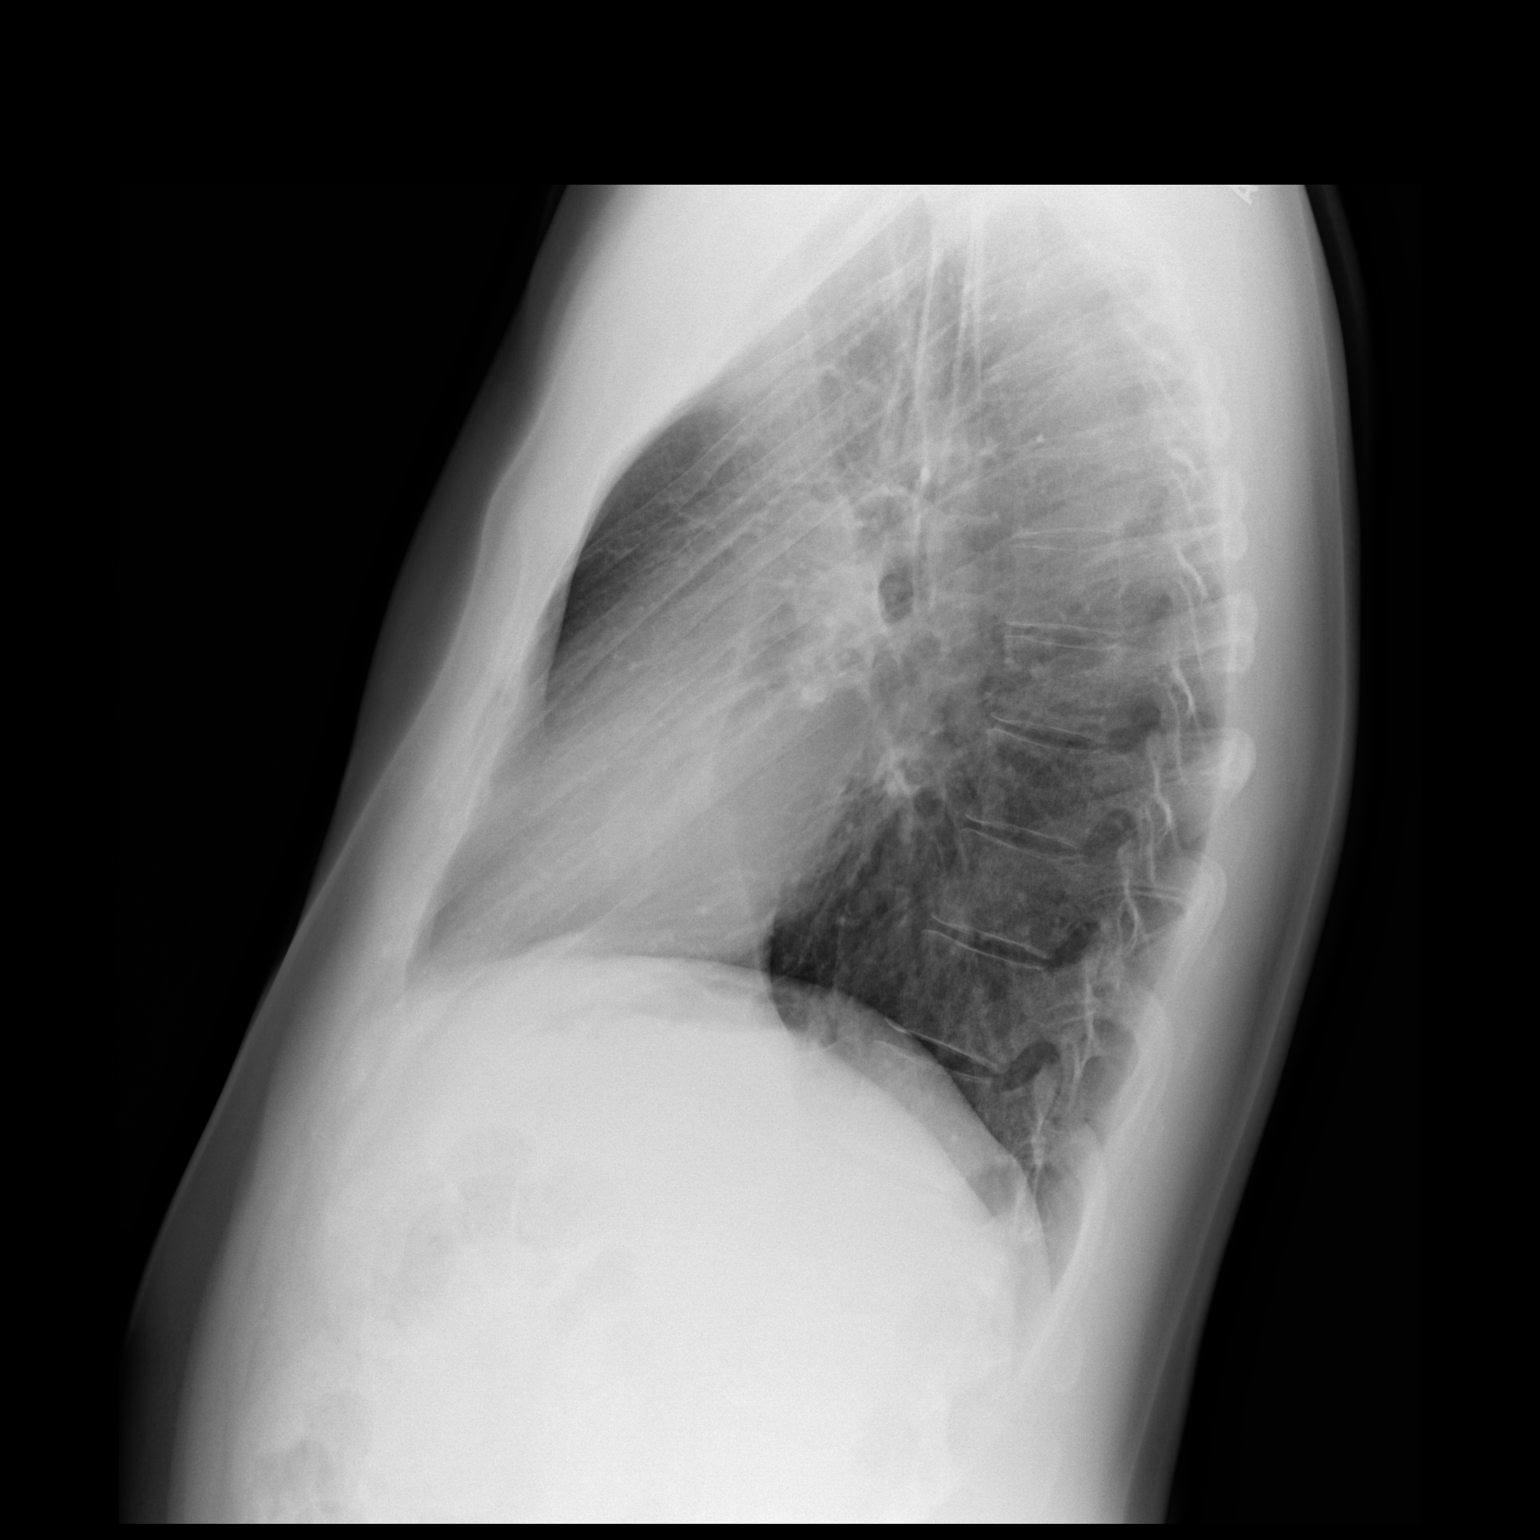

[2 of 2 positions shown; findings below may reference images not displayed]

FINDINGS: Heart size and mediastinal contours are within normal limits.
Questionable chronic bronchitic changes centrally. Lungs otherwise
clear. No pleural effusion or pneumothorax. Osseous structures about
the chest are unremarkable.
IMPRESSION: 1. Suspected chronic bronchitic changes within the
perihilar/suprahilar regions bilaterally.
2. No evidence of pneumonia or pulmonary edema.

## 2021-02-24 ENCOUNTER — Ambulatory Visit: Payer: Self-pay | Admitting: Cardiology

## 2021-03-03 ENCOUNTER — Ambulatory Visit: Payer: Self-pay | Admitting: Cardiology

## 2024-02-07 ENCOUNTER — Other Ambulatory Visit: Payer: Self-pay | Admitting: Medical Genetics
